# Patient Record
Sex: Female | Born: 1990 | Race: White | Hispanic: No | Marital: Single | Smoking: Never smoker
Health system: Southern US, Community
[De-identification: ages and names within clinical notes are randomized; demographics above are authoritative.]

## PROBLEM LIST (undated history)

## (undated) DIAGNOSIS — R519 Headache, unspecified: Secondary | ICD-10-CM

## (undated) DIAGNOSIS — R51 Headache: Secondary | ICD-10-CM

## (undated) HISTORY — PX: OTHER SURGICAL HISTORY: SHX169

---
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
## 2016-04-29 ENCOUNTER — Encounter (HOSPITAL_COMMUNITY): Payer: Self-pay | Admitting: Emergency Medicine

## 2016-04-29 ENCOUNTER — Emergency Department (HOSPITAL_COMMUNITY)
Admission: EM | Admit: 2016-04-29 | Discharge: 2016-04-29 | Disposition: A | Discharge status: Home / Self Care | Payer: PRIVATE HEALTH INSURANCE | Attending: Emergency Medicine | Admitting: Emergency Medicine

## 2016-04-29 ENCOUNTER — Emergency Department (HOSPITAL_COMMUNITY): Payer: PRIVATE HEALTH INSURANCE

## 2016-04-29 DIAGNOSIS — R102 Pelvic and perineal pain: Secondary | ICD-10-CM | POA: Insufficient documentation

## 2016-04-29 DIAGNOSIS — N83209 Unspecified ovarian cyst, unspecified side: Secondary | ICD-10-CM | POA: Insufficient documentation

## 2016-04-29 DIAGNOSIS — Z791 Long term (current) use of non-steroidal anti-inflammatories (NSAID): Secondary | ICD-10-CM | POA: Insufficient documentation

## 2016-04-29 HISTORY — DX: Headache, unspecified: R51.9

## 2016-04-29 HISTORY — DX: Headache: R51

## 2016-04-29 LAB — CBC
HEMATOCRIT: 38.2 % (ref 36.0–46.0)
HEMOGLOBIN: 12.6 g/dL (ref 12.0–15.0)
MCH: 29 pg (ref 26.0–34.0)
MCHC: 33 g/dL (ref 30.0–36.0)
MCV: 88 fL (ref 78.0–100.0)
Platelets: 220 10*3/uL (ref 150–400)
RBC: 4.34 MIL/uL (ref 3.87–5.11)
RDW: 13.6 % (ref 11.5–15.5)
WBC: 5.2 10*3/uL (ref 4.0–10.5)

## 2016-04-29 LAB — URINALYSIS, ROUTINE W REFLEX MICROSCOPIC
BILIRUBIN URINE: NEGATIVE
Glucose, UA: NEGATIVE mg/dL
HGB URINE DIPSTICK: NEGATIVE
KETONES UR: 15 mg/dL — AB
Leukocytes, UA: NEGATIVE
NITRITE: NEGATIVE
PH: 8 (ref 5.0–8.0)
Protein, ur: NEGATIVE mg/dL
SPECIFIC GRAVITY, URINE: 1.017 (ref 1.005–1.030)

## 2016-04-29 LAB — COMPREHENSIVE METABOLIC PANEL
ALBUMIN: 4.8 g/dL (ref 3.5–5.0)
ALK PHOS: 45 U/L (ref 38–126)
ALT: 16 U/L (ref 14–54)
AST: 18 U/L (ref 15–41)
Anion gap: 5 (ref 5–15)
BILIRUBIN TOTAL: 0.7 mg/dL (ref 0.3–1.2)
BUN: 16 mg/dL (ref 6–20)
CALCIUM: 9.4 mg/dL (ref 8.9–10.3)
CO2: 27 mmol/L (ref 22–32)
Chloride: 106 mmol/L (ref 101–111)
Creatinine, Ser: 0.71 mg/dL (ref 0.44–1.00)
GFR calc Af Amer: >60 mL/min (ref >60)
GFR calc non Af Amer: >60 mL/min (ref >60)
GLUCOSE: 84 mg/dL (ref 65–99)
Potassium: 3.8 mmol/L (ref 3.5–5.1)
SODIUM: 138 mmol/L (ref 135–145)
TOTAL PROTEIN: 7.4 g/dL (ref 6.5–8.1)

## 2016-04-29 LAB — LIPASE, BLOOD: Lipase: 29 U/L (ref 11–51)

## 2016-04-29 LAB — WET PREP, GENITAL
CLUE CELLS WET PREP: NONE SEEN
Sperm: NONE SEEN
Trich, Wet Prep: NONE SEEN
Yeast Wet Prep HPF POC: NONE SEEN

## 2016-04-29 LAB — I-STAT BETA HCG BLOOD, ED (MC, WL, AP ONLY): I-stat hCG, quantitative: <5 m[IU]/mL (ref <5)

## 2016-04-29 NOTE — ED Provider Notes (Signed)
[facility]-EMERGENCY DEPT Provider Note   CSN: [id number] Arrival date & time: 04/29/16  7829   History   Chief Complaint Chief Complaint  Patient presents with  . Abdominal Pain    HPI Sarah Morgan is a 25 y.o. female.  HPI  25 y.o. female, presents to the Emergency Department today complaining of pelvic pain since last night around 10pm. States that the pain was a dull ache and gradual worsened around 11pm. Tried Motrin with little relief. States pain has been constant since then. Worse with movement. Pain tolerable without movement. Rates 7/10. Denies N/V/D. No vaginal bleeding/discharge. No CP/SOB. No fevers. No headache. No dizziness. No syncope. No other symptoms noted.   LMP- Last week around 8th.   Past Medical History:  Diagnosis Date  . Generalized headaches     There are no active problems to display for this patient.   Past Surgical History:  Procedure Laterality Date  . arm surgery       OB History    No data available       Home Medications    Prior to Admission medications   Medication Sig Start Date End Date Taking? Authorizing Provider  ibuprofen (ADVIL,MOTRIN) 200 MG tablet Take 400 mg by mouth every 6 (six) hours as needed.   Yes Historical Provider, MD    Family History Family History  Problem Relation Age of Onset  . Hypertension Other   . Cancer Other   . Stroke Other     Social History Social History  Substance Use Topics  . Smoking status: Never Smoker  . Smokeless tobacco: Never Used  . Alcohol use No     Allergies   Review of patient's allergies indicates no known allergies.   Review of Systems Review of Systems ROS reviewed and all are negative for acute change except as noted in the HPI.  Physical Exam Updated Vital Signs BP 117/74 (BP Location: Right Arm)   Pulse 69   Temp 98.9 F (37.2 C) (Oral)   Resp 17   LMP 04/19/2016 (Exact Date)   SpO2 99%   Physical Exam  Constitutional: She is oriented to person,  place, and time. Vital signs are normal. She appears well-developed and well-nourished.  HENT:  Head: Normocephalic.  Right Ear: Hearing normal.  Left Ear: Hearing normal.  Eyes: Conjunctivae and EOM are normal. Pupils are equal, round, and reactive to light.  Neck: Normal range of motion. Neck supple.  Cardiovascular: Normal rate, regular rhythm, normal heart sounds and intact distal pulses.   Pulmonary/Chest: Effort normal and breath sounds normal.  Abdominal: Normal appearance. There is generalized tenderness and tenderness in the suprapubic area. There is guarding.  Abdomen diffusely tender.  Neurological: She is alert and oriented to person, place, and time.  Skin: Skin is warm and dry.  Psychiatric: She has a normal mood and affect. Her speech is normal and behavior is normal. Thought content normal.   Exam performed by Eston Esters,  exam chaperoned Date: 04/29/2016 Pelvic exam: normal external genitalia without evidence of trauma. VULVA: normal appearing vulva with no masses, tenderness or lesion. VAGINA: normal appearing vagina with normal color and discharge, no lesions. CERVIX: normal appearing cervix without lesions, cervical motion tenderness absent, cervical os closed with out purulent discharge; vaginal discharge - clear and copious, Wet prep and DNA probe for chlamydia and GC obtained.   ADNEXA: normal adnexa in size, Right Adnexal tenderness and no masses UTERUS: uterus is normal size, shape, consistency and  nontender.    ED Treatments / Results  Labs (all labs ordered are listed, but only abnormal results are displayed) Labs Reviewed  WET PREP, GENITAL - Abnormal; Notable for the following:       Result Value   WBC, Wet Prep HPF POC FEW (*)    All other components within normal limits  URINALYSIS, ROUTINE W REFLEX MICROSCOPIC (NOT AT [facility]) - Abnormal; Notable for the following:    APPearance CLOUDY (*)    Ketones, ur 15 (*)    All other components within normal  limits  LIPASE, BLOOD  COMPREHENSIVE METABOLIC PANEL  CBC  I-STAT BETA HCG BLOOD, ED ([facility], [facility], [facility] ONLY)  GC/CHLAMYDIA PROBE AMP ([place]) NOT AT [facility]    EKG  EKG Interpretation None       Radiology [place] Transvaginal Non-ob  Result Date: 04/29/2016 CLINICAL DATA:  Right-sided pelvic pain since 10 o'clock last night EXAM: TRANSABDOMINAL AND TRANSVAGINAL ULTRASOUND OF PELVIS DOPPLER ULTRASOUND OF OVARIES TECHNIQUE: Both transabdominal and transvaginal ultrasound examinations of the pelvis were performed. Transabdominal technique was performed for global imaging of the pelvis including uterus, ovaries, adnexal regions, and pelvic cul-de-sac. It was necessary to proceed with endovaginal exam following the transabdominal exam to visualize the uterus, endometrium, ovaries, and adnexal structures. Color and duplex Doppler ultrasound was utilized to evaluate blood flow to the ovaries. COMPARISON:  None in PACs. FINDINGS: Uterus Measurements: 8.3 x 3.6 x 4.4 cm. No fibroids or other mass visualized. Endometrium Thickness: 7.8 mm.  No focal abnormality visualized. Right ovary Measurements: 4.8 x 2.9 x 4.4 cm. There is a simple appearing right ovarian cyst measuring 2.3 x 1.8 x 2.3 cm. There is a small amount of fluid adjacent to the right ovary. Left ovary Measurements: 3.3 x 1.6 x 2.1 cm. Normal appearance/no adnexal mass. Pulsed Doppler evaluation of both ovaries demonstrates normal arterial and venous waveforms. Other findings There is a small amount of free pelvic fluid on the right. IMPRESSION: 1. Normal appearance of the uterus and endometrium. No fibroids are observed. 2. Simple appearing right ovarian cyst measuring up to 2.3 cm. There is a small amount of fluid adjacent to the right ovary which may reflect recent cyst rupture. 3. Normal appearing left left ovary. Electronically Signed   By: David  SwazilandJordan M.D.   On: 04/29/2016 08:59   [place] Pelvis Complete  Result Date: 04/29/2016 CLINICAL DATA:   Right-sided pelvic pain since 10 o'clock last night EXAM: TRANSABDOMINAL AND TRANSVAGINAL ULTRASOUND OF PELVIS DOPPLER ULTRASOUND OF OVARIES TECHNIQUE: Both transabdominal and transvaginal ultrasound examinations of the pelvis were performed. Transabdominal technique was performed for global imaging of the pelvis including uterus, ovaries, adnexal regions, and pelvic cul-de-sac. It was necessary to proceed with endovaginal exam following the transabdominal exam to visualize the uterus, endometrium, ovaries, and adnexal structures. Color and duplex Doppler ultrasound was utilized to evaluate blood flow to the ovaries. COMPARISON:  None in PACs. FINDINGS: Uterus Measurements: 8.3 x 3.6 x 4.4 cm. No fibroids or other mass visualized. Endometrium Thickness: 7.8 mm.  No focal abnormality visualized. Right ovary Measurements: 4.8 x 2.9 x 4.4 cm. There is a simple appearing right ovarian cyst measuring 2.3 x 1.8 x 2.3 cm. There is a small amount of fluid adjacent to the right ovary. Left ovary Measurements: 3.3 x 1.6 x 2.1 cm. Normal appearance/no adnexal mass. Pulsed Doppler evaluation of both ovaries demonstrates normal arterial and venous waveforms. Other findings There is a small amount of free pelvic fluid on the  right. IMPRESSION: 1. Normal appearance of the uterus and endometrium. No fibroids are observed. 2. Simple appearing right ovarian cyst measuring up to 2.3 cm. There is a small amount of fluid adjacent to the right ovary which may reflect recent cyst rupture. 3. Normal appearing left left ovary. Electronically Signed   By: David  [place] M.D.   On: 04/29/2016 08:59   [place] Art/ven Flow Abd Pelv Doppler  Result Date: 04/29/2016 CLINICAL DATA:  Right-sided pelvic pain since 10 o'clock last night EXAM: TRANSABDOMINAL AND TRANSVAGINAL ULTRASOUND OF PELVIS DOPPLER ULTRASOUND OF OVARIES TECHNIQUE: Both transabdominal and transvaginal ultrasound examinations of the pelvis were performed. Transabdominal technique was  performed for global imaging of the pelvis including uterus, ovaries, adnexal regions, and pelvic cul-de-sac. It was necessary to proceed with endovaginal exam following the transabdominal exam to visualize the uterus, endometrium, ovaries, and adnexal structures. Color and duplex Doppler ultrasound was utilized to evaluate blood flow to the ovaries. COMPARISON:  None in PACs. FINDINGS: Uterus Measurements: 8.3 x 3.6 x 4.4 cm. No fibroids or other mass visualized. Endometrium Thickness: 7.8 mm.  No focal abnormality visualized. Right ovary Measurements: 4.8 x 2.9 x 4.4 cm. There is a simple appearing right ovarian cyst measuring 2.3 x 1.8 x 2.3 cm. There is a small amount of fluid adjacent to the right ovary. Left ovary Measurements: 3.3 x 1.6 x 2.1 cm. Normal appearance/no adnexal mass. Pulsed Doppler evaluation of both ovaries demonstrates normal arterial and venous waveforms. Other findings There is a small amount of free pelvic fluid on the right. IMPRESSION: 1. Normal appearance of the uterus and endometrium. No fibroids are observed. 2. Simple appearing right ovarian cyst measuring up to 2.3 cm. There is a small amount of fluid adjacent to the right ovary which may reflect recent cyst rupture. 3. Normal appearing left left ovary. Electronically Signed   By: David  [place] M.D.   On: 04/29/2016 08:59    Procedures Procedures (including critical care time)  Medications Ordered in ED Medications - No data to display   Initial Impression / Assessment and Plan / ED Course  I have reviewed the triage vital signs and the nursing notes.  Pertinent labs & imaging results that were available during my care of the patient were reviewed by me and considered in my medical decision making (see chart for details).  Clinical Course    Final Clinical Impressions(s) / ED Diagnoses  I have reviewed and evaluated the relevant laboratory values I have reviewed and evaluated the relevant imaging studies.  I have  reviewed the relevant previous healthcare records. I obtained HPI from historian. Patient discussed with supervising physician  ED Course:  Assessment: Pt is a 25yF who presents with pelvic pain since last night. Worse with movement. Gradually worsening. On exam, pt in NAD. Nontoxic/nonseptic appearing. VSS. Afebrile. Lungs CTA. Heart RRR. Abdomen diffusely tender. Notably suprapbuic. GU Exam with right adnexal tenderness. CBC/BMP without leukocytosis. Preg negative. UA unremarkable. Wet prep unremarkable. Concern for Torsion. [place] negative .Did show right ovarian cyst with free fluid for possible cyst rupture. Likely pain from rupture. Low concern for appendicitis. Pain improvedin ED without intervention. Discussed with supervising physician. Plan is to DC home with follow up to PCP. At time of discharge, Patient is in no acute distress. Vital Signs are stable. Patient is able to ambulate. Patient able to tolerate PO.    Disposition/Plan:  DC Home Additional Verbal discharge instructions given and discussed with patient.  Pt Instructed to f/u with PCP  in the next week for evaluation and treatment of symptoms. Return precautions given Pt acknowledges and agrees with plan  Supervising Physician Azalia BilisKevin Campos, MD   Final diagnoses:  Ovarian cyst rupture    New Prescriptions New Prescriptions   No medications on file       Audry Piliyler Kandyce Dieguez, PA-C 04/29/16 1013    Azalia BilisKevin Campos, MD 04/29/16 [phone]

## 2016-04-29 NOTE — ED Notes (Signed)
US at bedside

## 2016-04-29 NOTE — Discharge Instructions (Signed)
Please read and follow all provided instructions.  Your diagnoses today include:  1. Ovarian cyst rupture     Tests performed today include: Vital signs. See below for your results today.   Medications prescribed:  Take as prescribed   Home care instructions:  Follow any educational materials contained in this packet.  Follow-up instructions: Please follow-up with your primary care provider for further evaluation of symptoms and treatment   Return instructions:  Please return to the Emergency Department if you do not get better, if you get worse, or new symptoms OR  - Fever (temperature greater than 101.49F)  - Bleeding that does not stop with holding pressure to the area    -Severe pain (please note that you may be more sore the day after your accident)  - Chest Pain  - Difficulty breathing  - Severe nausea or vomiting  - Inability to tolerate food and liquids  - Passing out  - Skin becoming red around your wounds  - Change in mental status (confusion or lethargy)  - New numbness or weakness    Please return if you have any other emergent concerns.  Additional Information:  Your vital signs today were: BP 107/61    Pulse 72    Temp 98.9 F (37.2 C) (Oral)    Resp 16    LMP 04/19/2016 (Exact Date)    SpO2 98%  If your blood pressure (BP) was elevated above 135/85 this visit, please have this repeated by your doctor within one month. --------------

## 2016-04-29 NOTE — ED Triage Notes (Signed)
Pt states she started having lower abd pain last night around 10  Pt states she took ibuprofen around 11pm but the pain has continued to get worse  Pt states the pain is in her mid to lower abdomen and it hurts worse when she walks

## 2016-04-29 NOTE — ED Notes (Signed)
PA at bedside.

## 2016-05-02 LAB — GC/CHLAMYDIA PROBE AMP (~~LOC~~) NOT AT ARMC
CHLAMYDIA, DNA PROBE: NEGATIVE
Neisseria Gonorrhea: NEGATIVE

## 2016-07-06 ENCOUNTER — Ambulatory Visit: Payer: PRIVATE HEALTH INSURANCE | Admitting: Family Medicine

## 2017-04-22 IMAGING — US US PELVIS COMPLETE
1 series · 13 of 25 positions shown · non-contrast
Comparison: None in PACs.

CLINICAL DATA: Right-sided pelvic pain since 10 o'clock last night

EXAM:
TRANSABDOMINAL AND TRANSVAGINAL ULTRASOUND OF PELVIS
DOPPLER ULTRASOUND OF OVARIES
TECHNIQUE: Both transabdominal and transvaginal ultrasound examinations of the
pelvis were performed. Transabdominal technique was performed for
global imaging of the pelvis including uterus, ovaries, adnexal
regions, and pelvic cul-de-sac.
It was necessary to proceed with endovaginal exam following the
transabdominal exam to visualize the uterus, endometrium, ovaries,
and adnexal structures.. Color and duplex Doppler ultrasound was
utilized to evaluate blood flow to the ovaries.

[Series 1: us pelvis complete · 0.20mm/px · 13 of 72 slices shown]
[im 1/72]
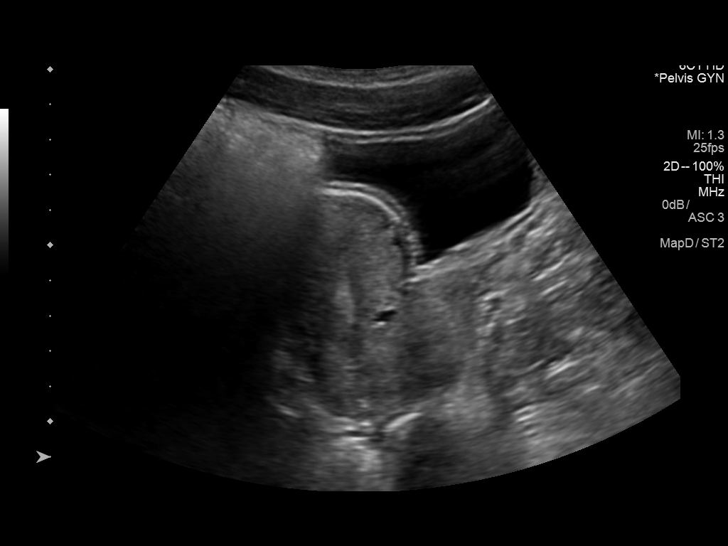
[im 6/72]
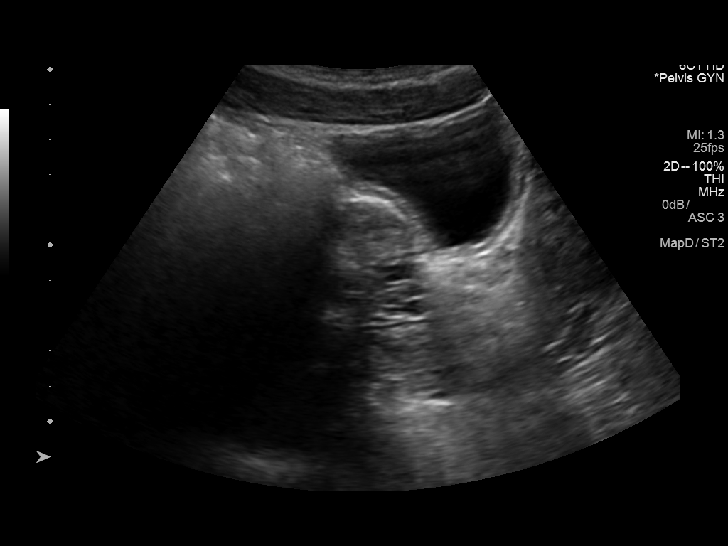
[im 12/72]
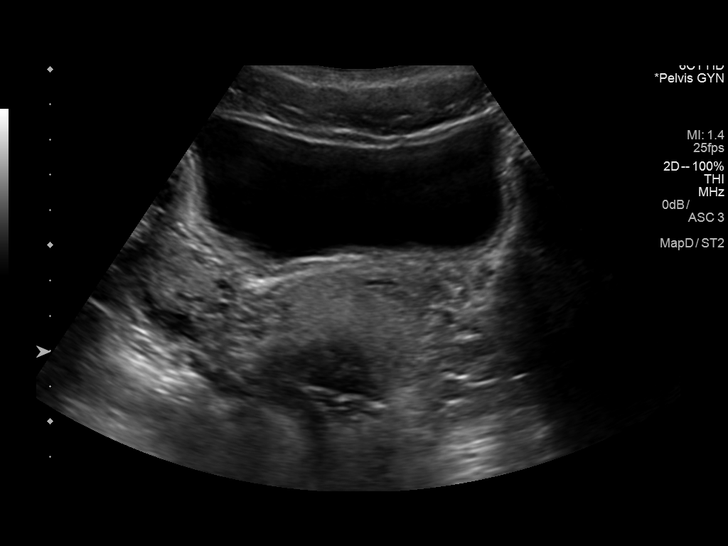
[im 18/72]
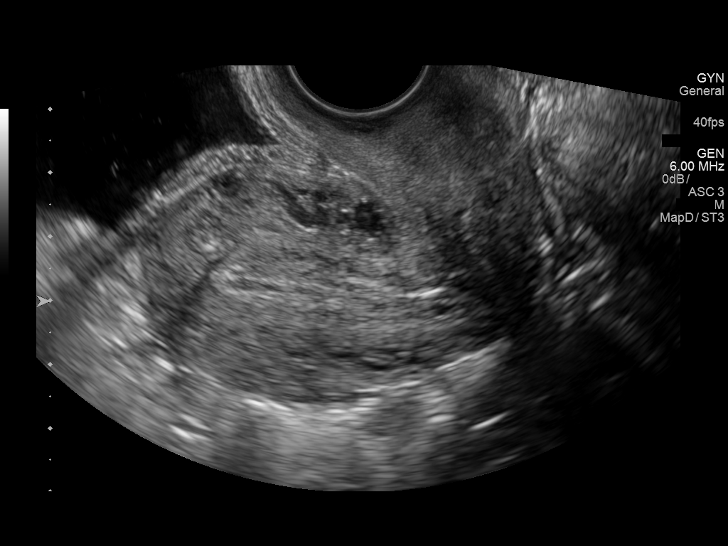
[im 24/72]
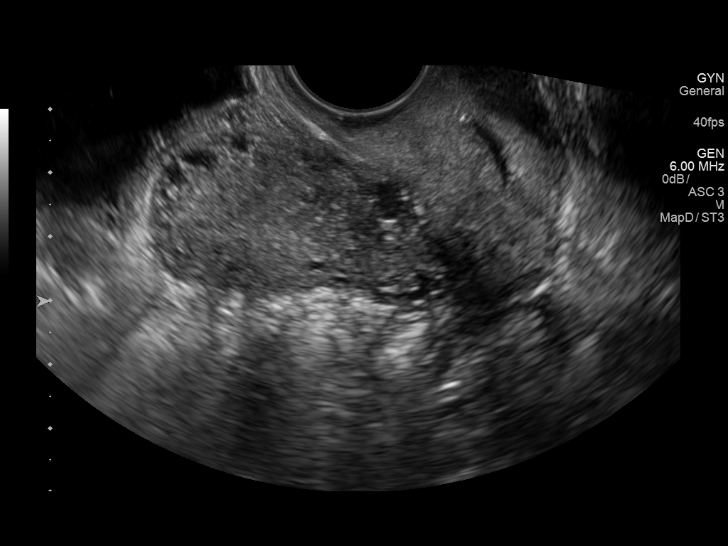
[im 30/72]
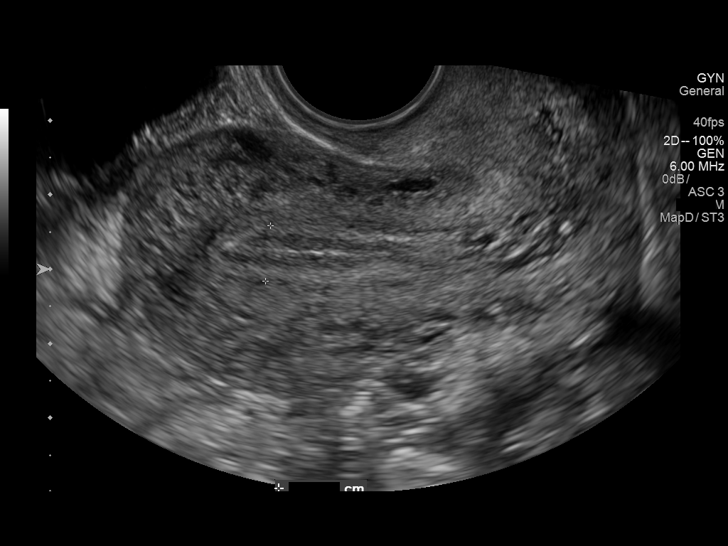
[im 36/72]
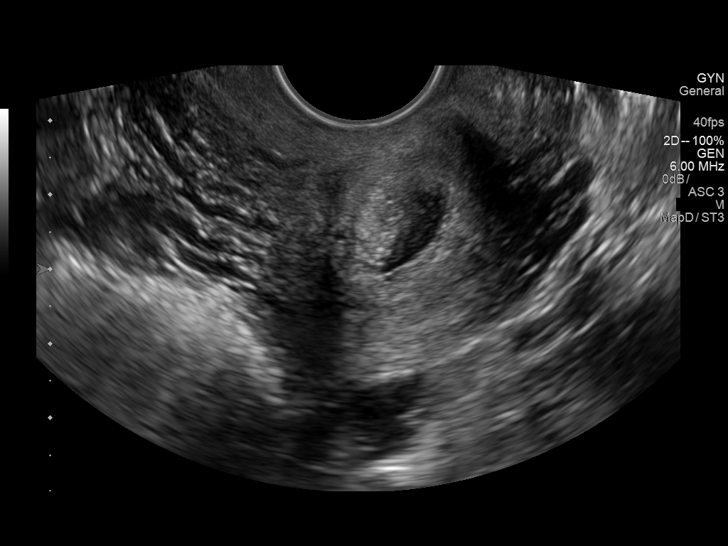
[im 42/72]
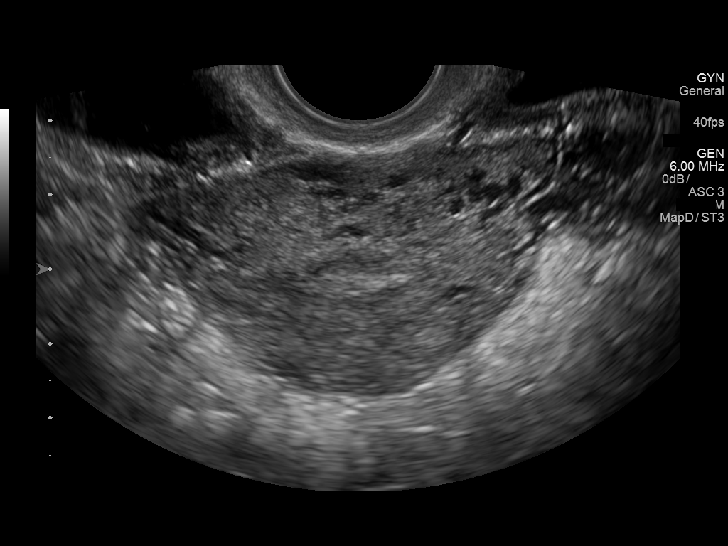
[im 48/72]
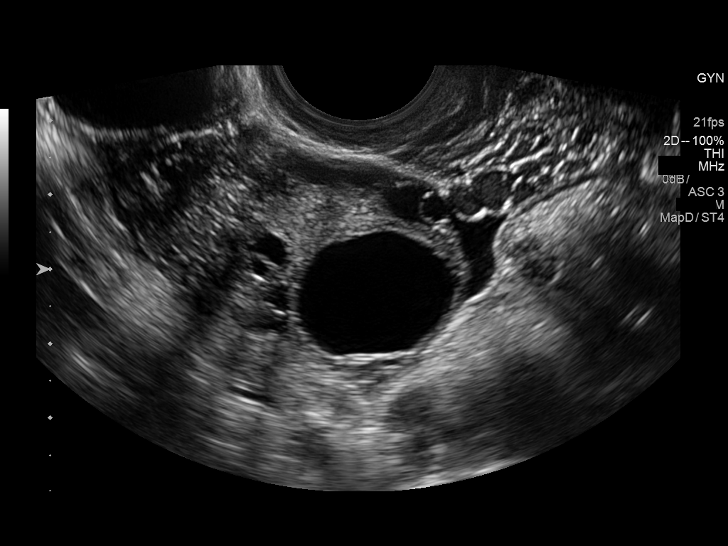
[im 54/72]
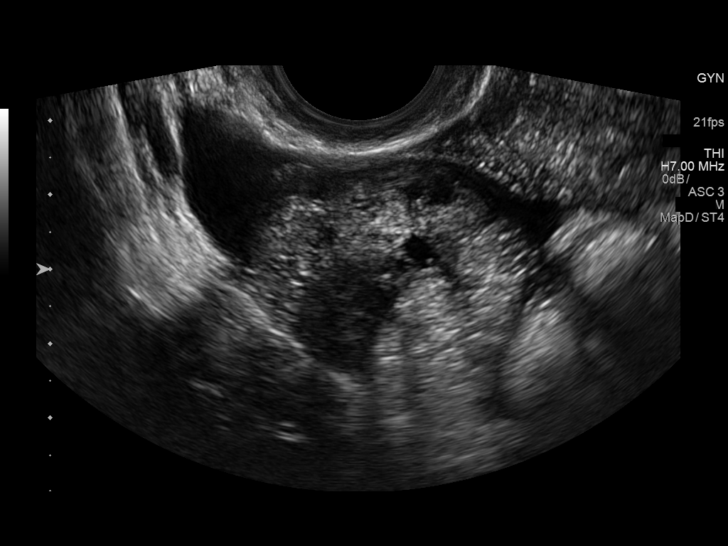
[im 60/72]
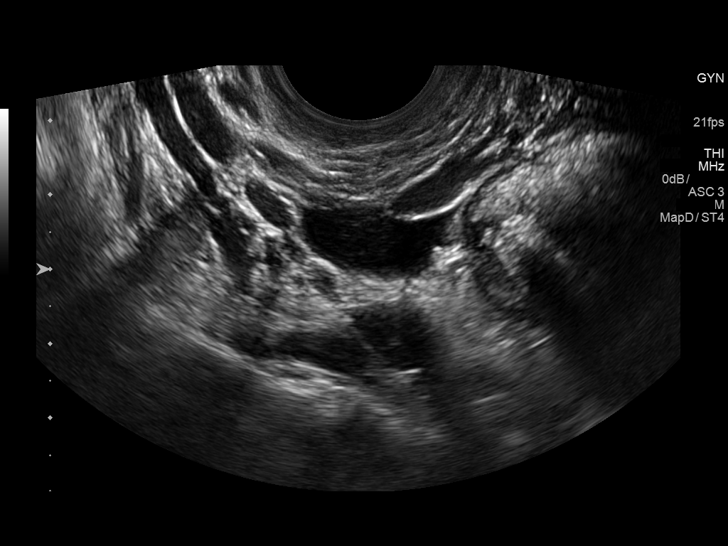
[im 66/72]
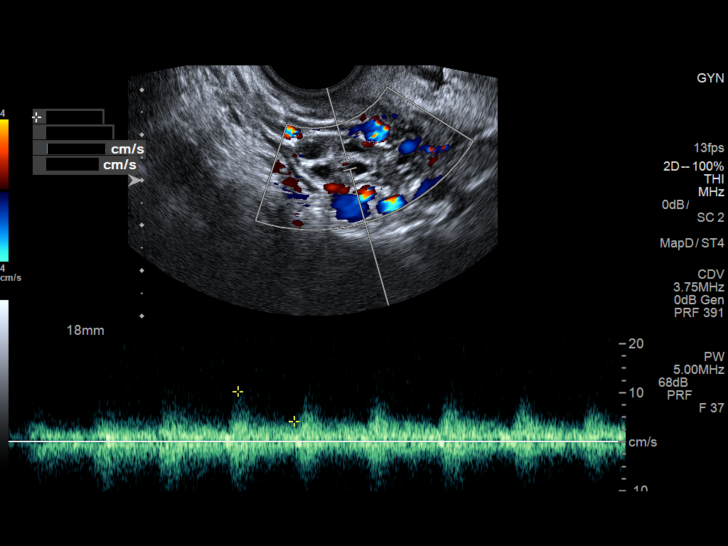
[im 72/72]
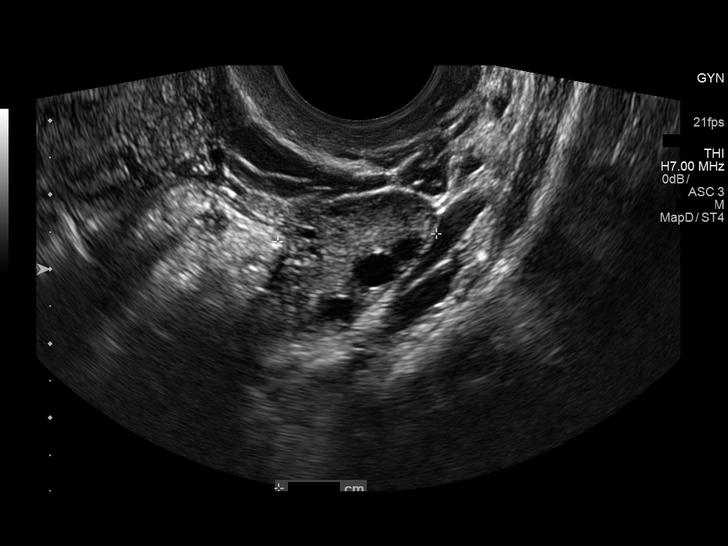

[13 of 25 positions shown; findings below may reference images not displayed]

FINDINGS: Uterus

Measurements: 8.3 x 3.6 x 4.4 cm. No fibroids or other mass
visualized.

Endometrium

Thickness: 7.8 mm.  No focal abnormality visualized.

Right ovary

Measurements: 4.8 x 2.9 x 4.4 cm.. There is a simple appearing right
ovarian cyst measuring 2.3 x 1.8 x 2.3 cm. There is a small amount
of fluid adjacent to the right ovary.

Left ovary

Measurements: 3.3 x 1.6 x 2.1 cm. Normal appearance/no adnexal mass.

Pulsed Doppler evaluation of both ovaries demonstrates normal
arterial and venous waveforms.

Other findings

There is a small amount of free pelvic fluid on the right.
IMPRESSION: 1. Normal appearance of the uterus and endometrium. No fibroids are
observed.
2. Simple appearing right ovarian cyst measuring up to 2.3 cm. There
is a small amount of fluid adjacent to the right ovary which may
reflect recent cyst rupture.
3. Normal appearing left left ovary.

## 2019-05-06 ENCOUNTER — Other Ambulatory Visit (HOSPITAL_COMMUNITY): Payer: PRIVATE HEALTH INSURANCE

## 2019-05-09 ENCOUNTER — Ambulatory Visit (HOSPITAL_COMMUNITY)
Admission: RE | Admit: 2019-05-09 | Payer: PRIVATE HEALTH INSURANCE | Source: Home / Self Care | Admitting: General Surgery

## 2019-05-09 ENCOUNTER — Encounter (HOSPITAL_COMMUNITY): Admission: RE | Payer: Self-pay | Source: Home / Self Care

## 2019-05-09 SURGERY — LYMPH NODE BIOPSY
Anesthesia: General | Laterality: Right

## 2022-12-02 ENCOUNTER — Ambulatory Visit (INDEPENDENT_AMBULATORY_CARE_PROVIDER_SITE_OTHER): Payer: Self-pay | Admitting: Orthopaedic Surgery

## 2022-12-02 ENCOUNTER — Other Ambulatory Visit (INDEPENDENT_AMBULATORY_CARE_PROVIDER_SITE_OTHER): Payer: Self-pay

## 2022-12-02 ENCOUNTER — Encounter: Payer: Self-pay | Admitting: Orthopaedic Surgery

## 2022-12-02 DIAGNOSIS — M79605 Pain in left leg: Secondary | ICD-10-CM

## 2022-12-02 NOTE — Progress Notes (Signed)
Office Visit Note   Patient: Sarah Morgan           Date of Birth: 1991/03/04           MRN: [id number] Visit Date: 12/02/2022              Requested by: No referring provider defined for this encounter. PCP: Patient, No Pcp Per   Assessment & Plan: Visit Diagnoses:  1. Pain in left leg     Plan: Impression is severe left quadriceps contusion.  Disease process explained and recommend symptomatic treatment with activity modification and NSAIDs.  Reassurance was provided that this is not compartment syndrome.  Follow-Up Instructions: No follow-ups on file.   Orders:  Orders Placed This Encounter  Procedures   XR FEMUR MIN 2 VIEWS LEFT   No orders of the defined types were placed in this encounter.     Procedures: No procedures performed   Clinical Data: No additional findings.   Subjective: Chief Complaint  Patient presents with   Left Leg - Pain    HPI  Joellen Jersey is a healthy 32 year old here for evaluation of left leg pain for about 2 to 3 weeks.  She does mixed martial arts and was kicked in that area about 2 to 3 weeks ago.  Since then she has had significant pain with any palpation or touching the area.  Went to a chiropractor who was concerned about bony injury and/or compartment syndrome.  Review of Systems  Constitutional: Negative.   HENT: Negative.    Eyes: Negative.   Respiratory: Negative.    Cardiovascular: Negative.   Endocrine: Negative.   Musculoskeletal: Negative.   Neurological: Negative.   Hematological: Negative.   Psychiatric/Behavioral: Negative.    All other systems reviewed and are negative.    Objective: Vital Signs: There were no vitals taken for this visit.  Physical Exam Vitals and nursing note reviewed.  Constitutional:      Appearance: She is well-developed.  HENT:     Head: Atraumatic.     Nose: Nose normal.  Eyes:     Extraocular Movements: Extraocular movements intact.  Cardiovascular:     Pulses: Normal  pulses.  Pulmonary:     Effort: Pulmonary effort is normal.  Abdominal:     Palpations: Abdomen is soft.  Musculoskeletal:     Cervical back: Neck supple.  Skin:    General: Skin is warm.     Capillary Refill: Capillary refill takes less than 2 seconds.  Neurological:     Mental Status: She is alert. Mental status is at baseline.  Psychiatric:        Behavior: Behavior normal.        Thought Content: Thought content normal.        Judgment: Judgment normal.    Ortho Exam  Examination of the left thigh and knee shows trace effusion in the joint.  Collaterals and cruciates are stable.  She is very tender to any palpation to the distal lateral quadriceps.  She has no motor or sensory dysfunction.  Compartments are soft.  Normal strength.  Specialty Comments:  No specialty comments available.  Imaging: XR FEMUR MIN 2 VIEWS LEFT  Result Date: 12/02/2022 No acute or structural abnormalities    PMFS History: There are no problems to display for this patient.  Past Medical History:  Diagnosis Date   Generalized headaches     Family History  Problem Relation Age of Onset   Hypertension Other  Cancer Other    Stroke Other     Past Surgical History:  Procedure Laterality Date   arm surgery      Social History   Occupational History   Not on file  Tobacco Use   Smoking status: Never   Smokeless tobacco: Never  Substance and Sexual Activity   Alcohol use: No   Drug use: No   Sexual activity: Not on file

## 2024-09-25 NOTE — Progress Notes (Signed)
Treadmill stress test done.

## 2024-10-02 NOTE — Telephone Encounter (Signed)
 Pt. Called for results. Pt informed of results and recommendations as scripted. Voiced understanding.

## 2024-10-17 ENCOUNTER — Emergency Department (HOSPITAL_COMMUNITY)
Admission: EM | Admit: 2024-10-17 | Discharge: 2024-10-17 | Disposition: A | Discharge status: Home / Self Care | Payer: Self-pay | Source: Home / Self Care

## 2024-10-17 NOTE — ED Triage Notes (Signed)
 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   Serum Brain Biomarker's: S100B ; GFAP ; UCH-L1, NFL
